# Patient Record
Sex: Female | Born: 1990 | Race: White | Hispanic: No | Marital: Single | Smoking: Current every day smoker
Health system: Southern US, Community
[De-identification: ages and names within clinical notes are randomized; demographics above are authoritative.]

## PROBLEM LIST (undated history)

## (undated) DIAGNOSIS — R000 Tachycardia, unspecified: Secondary | ICD-10-CM

## (undated) HISTORY — PX: OTHER SURGICAL HISTORY: SHX169

---
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
## 2012-10-23 ENCOUNTER — Encounter: Payer: Self-pay | Admitting: Cardiology

## 2012-12-06 ENCOUNTER — Encounter (HOSPITAL_BASED_OUTPATIENT_CLINIC_OR_DEPARTMENT_OTHER): Payer: Self-pay | Admitting: Emergency Medicine

## 2012-12-06 ENCOUNTER — Emergency Department (HOSPITAL_BASED_OUTPATIENT_CLINIC_OR_DEPARTMENT_OTHER)
Admission: EM | Admit: 2012-12-06 | Discharge: 2012-12-06 | Disposition: A | Discharge status: Home / Self Care | Payer: Medicaid Other | Attending: Emergency Medicine | Admitting: Emergency Medicine

## 2012-12-06 DIAGNOSIS — F17200 Nicotine dependence, unspecified, uncomplicated: Secondary | ICD-10-CM | POA: Insufficient documentation

## 2012-12-06 DIAGNOSIS — R82998 Other abnormal findings in urine: Secondary | ICD-10-CM | POA: Insufficient documentation

## 2012-12-06 DIAGNOSIS — R42 Dizziness and giddiness: Secondary | ICD-10-CM | POA: Insufficient documentation

## 2012-12-06 DIAGNOSIS — R8271 Bacteriuria: Secondary | ICD-10-CM

## 2012-12-06 DIAGNOSIS — R110 Nausea: Secondary | ICD-10-CM | POA: Insufficient documentation

## 2012-12-06 DIAGNOSIS — R52 Pain, unspecified: Secondary | ICD-10-CM | POA: Insufficient documentation

## 2012-12-06 DIAGNOSIS — J329 Chronic sinusitis, unspecified: Secondary | ICD-10-CM | POA: Insufficient documentation

## 2012-12-06 DIAGNOSIS — Z3202 Encounter for pregnancy test, result negative: Secondary | ICD-10-CM | POA: Insufficient documentation

## 2012-12-06 LAB — URINALYSIS, ROUTINE W REFLEX MICROSCOPIC
Nitrite: POSITIVE — AB
Protein, ur: NEGATIVE mg/dL
Specific Gravity, Urine: 1.024 (ref 1.005–1.030)
Urobilinogen, UA: 0.2 mg/dL (ref 0.0–1.0)

## 2012-12-06 LAB — URINE MICROSCOPIC-ADD ON

## 2012-12-06 LAB — PREGNANCY, URINE: Preg Test, Ur: NEGATIVE

## 2012-12-06 MED ORDER — ACETAMINOPHEN 325 MG PO TABS
650.0000 mg | ORAL_TABLET | Freq: Once | ORAL | Status: AC
  Administered 2012-12-06: 650 mg via ORAL
  Filled 2012-12-06: qty 2

## 2012-12-06 MED ORDER — TRIAMCINOLONE ACETONIDE 55 MCG/ACT NA AERO: 2.0000 | INHALATION_SPRAY | Freq: Every day | NASAL | Status: DC

## 2012-12-06 MED ORDER — SULFAMETHOXAZOLE-TRIMETHOPRIM 800-160 MG PO TABS: 1.0000 | ORAL_TABLET | Freq: Two times a day (BID) | ORAL | Status: DC

## 2012-12-06 NOTE — ED Provider Notes (Signed)
CSN: [id number]     Arrival date & time 12/06/12  1624 History   First MD Initiated Contact with Patient 12/06/12 1636     Chief Complaint  Patient presents with  . Fever   (Consider location/radiation/quality/duration/timing/severity/associated sxs/prior Treatment) HPI Comments: A 22 yr old present to ED complaining of fever since yesterday that has been getting worse. Fever of 102.9 at 2pm today and took 400 mg ibuprofen. She also has generalized body ache, nausea, constant frontal headache, dizziness with movement and chills. She denies CP, SOB, vomiting, light sensitivity, dysuria,urinary frequency or urgency. She also states that she has a history of tachycardia for many years and managed by cardiologist. She is due to seen by cardiologist. No chest pain.  Patient is a 22 y.o. female presenting with fever. The history is provided by the patient. No language interpreter was used.  Fever Max temp prior to arrival:  102.9 (2pm) Associated symptoms: headaches and nausea   Associated symptoms: no chest pain, no diarrhea, no dysuria, no ear pain, no rhinorrhea, no sore throat and no vomiting     Past Medical History  Diagnosis Date  . Chest pain   . Chest pain    Past Surgical History  Procedure Laterality Date  . None     Family History  Problem Relation Age of Onset  . Coronary artery disease Paternal Grandfather 30  . Coronary artery disease Paternal Uncle 67  . Hyperlipidemia Mother    History  Substance Use Topics  . Smoking status: Current Every Day Smoker    Types: Cigarettes  . Smokeless tobacco: Not on file  . Alcohol Use: No   OB History   Grav Para Term Preterm Abortions TAB SAB Ect Mult Living                 Review of Systems  Constitutional: Positive for fever.  HENT: Positive for sinus pressure. Negative for ear pain, rhinorrhea and sore throat.   Cardiovascular: Negative for chest pain.  Gastrointestinal: Positive for nausea. Negative for vomiting,  abdominal pain and diarrhea.  Endocrine: Negative for polyuria.  Genitourinary: Negative for dysuria and hematuria.  Musculoskeletal: Negative for neck stiffness.  Neurological: Positive for headaches.    Allergies  Review of patient's allergies indicates no known allergies.  Home Medications   Current Outpatient Rx  Name  Route  Sig  Dispense  Refill  . ibuprofen (ADVIL,MOTRIN) 400 MG tablet   Oral   Take 400 mg by mouth every 6 (six) hours as needed.         . Lisdexamfetamine Dimesylate (VYVANSE PO)   Oral   Take 1 capsule by mouth once.          . sulfamethoxazole-trimethoprim (SEPTRA DS) 800-160 MG per tablet   Oral   Take 1 tablet by mouth every 12 (twelve) hours.   14 tablet   0   . triamcinolone (NASACORT AQ) 55 MCG/ACT AERO nasal inhaler   Nasal   Place 2 sprays into the nose daily.   1 Inhaler   12    BP 100/66  Pulse 95  Temp(Src) 98.8 F (37.1 C) (Oral)  Resp 16  Ht 5\' 1"  (1.549 m)  Wt 103 lb (46.72 kg)  BMI 19.47 kg/m2  SpO2 100%  LMP 11/29/2012 Physical Exam  Constitutional: She is oriented to person, place, and time. She appears well-developed and well-nourished.  HENT:  Head: Normocephalic.  Right Ear: External ear normal.  Left Ear: External ear normal.  Nose: Mucosal edema present. Right sinus exhibits frontal sinus tenderness. Left sinus exhibits frontal sinus tenderness.  Mouth/Throat: Oropharynx is clear and moist.  Neck: Normal range of motion. Neck supple.  Cardiovascular: Normal rate and normal heart sounds.   No murmur heard. Pulmonary/Chest: Effort normal and breath sounds normal. She has no wheezes. She has no rales.  Abdominal: Soft. Bowel sounds are normal. She exhibits no distension. There is no tenderness.  Musculoskeletal: Normal range of motion.  Lymphadenopathy:    She has no cervical adenopathy.  Neurological: She is alert and oriented to person, place, and time.  Skin: Skin is warm and dry. No pallor.    ED  Course  Procedures (including critical care time) Labs Review Labs Reviewed  URINALYSIS, ROUTINE W REFLEX MICROSCOPIC - Abnormal; Notable for the following:    APPearance CLOUDY (*)    Hgb urine dipstick MODERATE (*)    Ketones, ur >80 (*)    Nitrite POSITIVE (*)    Leukocytes, UA SMALL (*)    All other components within normal limits  URINE MICROSCOPIC-ADD ON - Abnormal; Notable for the following:    Bacteria, UA MANY (*)    All other components within normal limits  URINE CULTURE  PREGNANCY, URINE   Imaging Review No results found.  EKG Interpretation   None       MDM   1. Asymptomatic bacteriuria   2. Sinusitis    She persistently denies symptomatic UTI, culture pending for bacteriuria found on UA today. Will treat with Septra for symptoms or sinusitis, which will cover UTI as well.     Arnoldo Hooker, PA-C 12/07/12 1515

## 2012-12-06 NOTE — ED Notes (Signed)
Pt c/o fever and body aches X2 days 

## 2012-12-08 NOTE — ED Provider Notes (Signed)
Medical screening examination/treatment/procedure(s) were performed by non-physician practitioner and as supervising physician I was immediately available for consultation/collaboration.  EKG Interpretation   None         Gwyneth Sprout, MD 12/08/12 1410

## 2012-12-09 ENCOUNTER — Encounter (HOSPITAL_COMMUNITY): Payer: Self-pay | Admitting: Emergency Medicine

## 2012-12-09 ENCOUNTER — Emergency Department (HOSPITAL_COMMUNITY): Payer: Medicaid Other

## 2012-12-09 ENCOUNTER — Emergency Department (HOSPITAL_COMMUNITY)
Admission: EM | Admit: 2012-12-09 | Discharge: 2012-12-10 | Disposition: A | Discharge status: Home / Self Care | Payer: Medicaid Other | Attending: Emergency Medicine | Admitting: Emergency Medicine

## 2012-12-09 DIAGNOSIS — R109 Unspecified abdominal pain: Secondary | ICD-10-CM | POA: Insufficient documentation

## 2012-12-09 DIAGNOSIS — F17200 Nicotine dependence, unspecified, uncomplicated: Secondary | ICD-10-CM | POA: Insufficient documentation

## 2012-12-09 DIAGNOSIS — M545 Low back pain, unspecified: Secondary | ICD-10-CM | POA: Insufficient documentation

## 2012-12-09 DIAGNOSIS — Z8679 Personal history of other diseases of the circulatory system: Secondary | ICD-10-CM | POA: Insufficient documentation

## 2012-12-09 DIAGNOSIS — Z3202 Encounter for pregnancy test, result negative: Secondary | ICD-10-CM | POA: Insufficient documentation

## 2012-12-09 DIAGNOSIS — Z79899 Other long term (current) drug therapy: Secondary | ICD-10-CM | POA: Insufficient documentation

## 2012-12-09 DIAGNOSIS — N390 Urinary tract infection, site not specified: Secondary | ICD-10-CM

## 2012-12-09 HISTORY — DX: Tachycardia, unspecified: R00.0

## 2012-12-09 LAB — BASIC METABOLIC PANEL
BUN: 6 mg/dL (ref 6–23)
CO2: 18 mEq/L — ABNORMAL LOW (ref 19–32)
Calcium: 8.7 mg/dL (ref 8.4–10.5)
Creatinine, Ser: 0.6 mg/dL (ref 0.50–1.10)
GFR calc non Af Amer: >90 mL/min (ref >90)
Glucose, Bld: 64 mg/dL — ABNORMAL LOW (ref 70–99)

## 2012-12-09 LAB — PREGNANCY, URINE: Preg Test, Ur: NEGATIVE

## 2012-12-09 LAB — URINALYSIS, ROUTINE W REFLEX MICROSCOPIC
Glucose, UA: NEGATIVE mg/dL
Hgb urine dipstick: NEGATIVE
Protein, ur: NEGATIVE mg/dL
Specific Gravity, Urine: 1.023 (ref 1.005–1.030)

## 2012-12-09 LAB — CBC
HCT: 39.1 % (ref 36.0–46.0)
Hemoglobin: 13.7 g/dL (ref 12.0–15.0)
MCH: 30.9 pg (ref 26.0–34.0)
MCHC: 35 g/dL (ref 30.0–36.0)
MCV: 88.3 fL (ref 78.0–100.0)
RBC: 4.43 MIL/uL (ref 3.87–5.11)

## 2012-12-09 LAB — URINE CULTURE: Colony Count: >=100000

## 2012-12-09 MED ORDER — MORPHINE SULFATE 4 MG/ML IJ SOLN
4.0000 mg | Freq: Once | INTRAMUSCULAR | Status: AC
  Administered 2012-12-09: 4 mg via INTRAVENOUS
  Filled 2012-12-09: qty 1

## 2012-12-09 MED ORDER — CEFTRIAXONE SODIUM 1 G IJ SOLR
1.0000 g | Freq: Once | INTRAMUSCULAR | Status: AC
  Administered 2012-12-09: 1 g via INTRAVENOUS
  Filled 2012-12-09: qty 10

## 2012-12-09 MED ORDER — SODIUM CHLORIDE 0.9 % IV BOLUS (SEPSIS)
1000.0000 mL | Freq: Once | INTRAVENOUS | Status: AC
  Administered 2012-12-09: 1000 mL via INTRAVENOUS

## 2012-12-09 MED ORDER — ONDANSETRON HCL 4 MG/2ML IJ SOLN
4.0000 mg | Freq: Once | INTRAMUSCULAR | Status: AC
  Administered 2012-12-09: 4 mg via INTRAVENOUS
  Filled 2012-12-09: qty 2

## 2012-12-09 MED ORDER — MORPHINE SULFATE 4 MG/ML IJ SOLN
4.0000 mg | Freq: Once | INTRAMUSCULAR | Status: AC
  Administered 2012-12-10: 4 mg via INTRAVENOUS
  Filled 2012-12-09: qty 1

## 2012-12-09 NOTE — ED Notes (Signed)
PA at bedside.

## 2012-12-09 NOTE — ED Notes (Signed)
The pt has had abd pain urinary symptoms for 3-4 days. Painful urination.  [facility] and diagnosed with a uti.  lmp 1 week

## 2012-12-09 NOTE — ED Provider Notes (Signed)
CSN: [id number]     Arrival date & time 12/09/12  1826 History   First MD Initiated Contact with Patient 12/09/12 2021     Chief Complaint  Patient presents with  . Emesis   (Consider location/radiation/quality/duration/timing/severity/associated sxs/prior Treatment) HPI Comments: The patient is a 22 year old female presenting to the emergency department for suprapubic abdominal pain with radiation to low back with associated dysuria for 4 days. Patient states she was seen [facility] ago and diagnosed with a urinary tract infection. She states since then she has developed nausea and vomiting and has been unable to take her antibiotic. She states she's had only 3 episodes of emesis that she describes as nonbloody nonbilious. She states her pain comes in waves and describes it as sharp at worst. Her pain is currently a 9/10. No abdominal surgical history.   Patient is a 22 y.o. female presenting with vomiting.  Emesis Associated symptoms: abdominal pain   Associated symptoms: no chills and no diarrhea     Past Medical History  Diagnosis Date  . Chest pain   . Chest pain   . Tachycardia    Past Surgical History  Procedure Laterality Date  . None     Family History  Problem Relation Age of Onset  . Coronary artery disease Paternal Grandfather 30  . Coronary artery disease Paternal Uncle 23  . Hyperlipidemia Mother    History  Substance Use Topics  . Smoking status: Current Every Day Smoker    Types: Cigarettes  . Smokeless tobacco: Not on file  . Alcohol Use: No   OB History   Grav Para Term Preterm Abortions TAB SAB Ect Mult Living                 Review of Systems  Constitutional: Negative for chills.  Gastrointestinal: Positive for nausea, vomiting and abdominal pain. Negative for diarrhea and constipation.  Genitourinary: Positive for dysuria, frequency and flank pain. Negative for vaginal bleeding, vaginal discharge and vaginal pain.    Musculoskeletal: Positive for back pain.  All other systems reviewed and are negative.    Allergies  Review of patient's allergies indicates no known allergies.  Home Medications   Current Outpatient Rx  Name  Route  Sig  Dispense  Refill  . ibuprofen (ADVIL,MOTRIN) 400 MG tablet   Oral   Take 400 mg by mouth every 6 (six) hours as needed.         Marland Kitchen levonorgestrel-ethinyl estradiol (ORSYTHIA) 0.1-20 MG-MCG tablet   Oral   Take 1 tablet by mouth daily.         . Phenazopyridine HCl (AZO STANDARD MAXIMUM STRENGTH) 97.5 MG TABS   Oral   Take 1 tablet by mouth daily.         Marland Kitchen sulfamethoxazole-trimethoprim (SEPTRA DS) 800-160 MG per tablet   Oral   Take 1 tablet by mouth every 12 (twelve) hours.   14 tablet   0   . triamcinolone (NASACORT AQ) 55 MCG/ACT AERO nasal inhaler   Nasal   Place 2 sprays into the nose daily.   1 Inhaler   12    BP 118/66  Pulse 127  Temp(Src) 99.9 F (37.7 C) (Oral)  Resp 18  SpO2 97%  LMP 11/29/2012 Physical Exam  Constitutional: She is oriented to person, place, and time. She appears well-developed and well-nourished. No distress.  HENT:  Head: Normocephalic and atraumatic.  Right Ear: External ear normal.  Left Ear: External ear normal.  Nose: Nose normal.  Eyes: Conjunctivae are normal.  Neck: Neck supple.  Cardiovascular: Normal rate, regular rhythm and normal heart sounds.   Pulmonary/Chest: Effort normal and breath sounds normal. No respiratory distress.  Abdominal: Soft. Bowel sounds are normal. She exhibits no distension. There is tenderness in the suprapubic area. There is no rigidity, no rebound and no guarding.  Musculoskeletal: Normal range of motion. She exhibits no edema.       Arms: Neurological: She is alert and oriented to person, place, and time.  Skin: Skin is warm and dry. She is not diaphoretic.    ED Course  Procedures (including critical care time) Medications  sodium chloride 0.9 % bolus 1,000 mL  (1,000 mLs Intravenous New Bag/Given 12/09/12 2130)  ondansetron (ZOFRAN) injection 4 mg (4 mg Intravenous Given 12/09/12 2130)  morphine 4 MG/ML injection 4 mg (4 mg Intravenous Given 12/09/12 2131)    Labs Review Labs Reviewed  URINALYSIS, ROUTINE W REFLEX MICROSCOPIC - Abnormal; Notable for the following:    Color, Urine ORANGE (*)    Bilirubin Urine SMALL (*)    Ketones, ur >80 (*)    Nitrite POSITIVE (*)    Leukocytes, UA MODERATE (*)    All other components within normal limits  URINE MICROSCOPIC-ADD ON - Abnormal; Notable for the following:    Squamous Epithelial / LPF FEW (*)    All other components within normal limits  BASIC METABOLIC PANEL - Abnormal; Notable for the following:    Sodium 127 (*)    Chloride 94 (*)    CO2 18 (*)    Glucose, Bld 64 (*)    All other components within normal limits  URINE CULTURE  PREGNANCY, URINE  CBC   Imaging Review Ct Abdomen Pelvis Wo Contrast  12/09/2012   CLINICAL DATA:  Left flank pain, dysuria  EXAM: CT ABDOMEN AND PELVIS WITHOUT CONTRAST  TECHNIQUE: Multidetector CT imaging of the abdomen and pelvis was performed following the standard protocol without intravenous contrast.  COMPARISON:  None.  FINDINGS: The visualized lung bases are clear.  The liver demonstrates a normal unenhanced appearance. The gallbladder, spleen, adrenal glands, and pancreas demonstrate a normal unenhanced appearance.  The kidneys are equal in size without evidence of nephrolithiasis or hydronephrosis. No stones are seen along the course of either renal collecting system.  There is no evidence of bowel obstruction. No abnormal wall thickening or inflammatory stranding seen about the bowels. Visualized appendix is within normal limits without evidence of acute appendicitis.  Bladder is unremarkable.  1.8 x 2.2 cm hypodense cyst is noted within the right ovary, likely physiologic. Uterus and ovaries are otherwise unremarkable.  No free air or fluid. No enlarged  intra-abdominal or pelvic lymph nodes are identified.  Osseous structures are within normal limits.  IMPRESSION: 1. No CT evidence of nephrolithiasis or obstructive uropathy. 2. No acute intra-abdominal or pelvic process identified. No findings to explain left-sided flank pain. 3. Probable physiologic right ovarian cyst.   Electronically Signed   By: Rise Mu M.D.   On: 12/09/2012 22:24    EKG Interpretation   None       MDM  No diagnosis found.  Afebrile, NAD, non-toxic appearing, AAOx4. I have reviewed nursing notes, vital signs, and all appropriate lab and imaging results for this patient. Patient noted to be mildly acidotic, hydrated patient and will recheck BMP to ensure improvement. No evidence of pyelonephritis or kidney stone on CT scan. Notified patient of cyst findings. Will d/c Septra  d/t intolerance and change Abx. Will prescribe zofran for nausea. Patient received IV Rocephin in ED.    1:21 AM BMP still pending. Patient pain controlled and able to tolerate PO fluids in ED w/o difficulty. Will sign patient out to [place], PA-C with plan to d/c patient if improvement in labs.      Jeannetta Ellis, PA-C 12/10/12 0124

## 2012-12-10 LAB — BASIC METABOLIC PANEL
BUN: 5 mg/dL — ABNORMAL LOW (ref 6–23)
Calcium: 7.2 mg/dL — ABNORMAL LOW (ref 8.4–10.5)
Creatinine, Ser: 0.55 mg/dL (ref 0.50–1.10)
GFR calc Af Amer: >90 mL/min (ref >90)
GFR calc non Af Amer: >90 mL/min (ref >90)
Glucose, Bld: 90 mg/dL (ref 70–99)
Sodium: 131 mEq/L — ABNORMAL LOW (ref 135–145)

## 2012-12-10 MED ORDER — ONDANSETRON 4 MG PO TBDP: 4.0000 mg | ORAL_TABLET | Freq: Three times a day (TID) | ORAL | Status: DC | PRN

## 2012-12-10 MED ORDER — GI COCKTAIL ~~LOC~~
30.0000 mL | Freq: Once | ORAL | Status: AC
  Administered 2012-12-10: 30 mL via ORAL
  Filled 2012-12-10: qty 30

## 2012-12-10 MED ORDER — CIPROFLOXACIN HCL 500 MG PO TABS: 500.0000 mg | ORAL_TABLET | Freq: Two times a day (BID) | ORAL | Status: DC

## 2012-12-10 NOTE — ED Provider Notes (Signed)
Medical screening examination/treatment/procedure(s) were performed by non-physician practitioner and as supervising physician I was immediately available for consultation/collaboration.  Shaleen Talamantez M Nazier Neyhart, MD 12/10/12 1242 

## 2012-12-12 LAB — URINE CULTURE: Colony Count: 60000

## 2013-12-01 ENCOUNTER — Emergency Department (HOSPITAL_COMMUNITY)
Admission: EM | Admit: 2013-12-01 | Discharge: 2013-12-02 | Disposition: A | Discharge status: Home / Self Care | Payer: Self-pay | Attending: Emergency Medicine | Admitting: Emergency Medicine

## 2013-12-01 ENCOUNTER — Encounter (HOSPITAL_COMMUNITY): Payer: Self-pay | Admitting: Emergency Medicine

## 2013-12-01 DIAGNOSIS — F39 Unspecified mood [affective] disorder: Secondary | ICD-10-CM

## 2013-12-01 DIAGNOSIS — R000 Tachycardia, unspecified: Secondary | ICD-10-CM | POA: Insufficient documentation

## 2013-12-01 DIAGNOSIS — Z720 Tobacco use: Secondary | ICD-10-CM | POA: Insufficient documentation

## 2013-12-01 DIAGNOSIS — R45851 Suicidal ideations: Secondary | ICD-10-CM

## 2013-12-01 DIAGNOSIS — F329 Major depressive disorder, single episode, unspecified: Secondary | ICD-10-CM

## 2013-12-01 DIAGNOSIS — T43602A Poisoning by unspecified psychostimulants, intentional self-harm, initial encounter: Secondary | ICD-10-CM | POA: Insufficient documentation

## 2013-12-01 DIAGNOSIS — Z793 Long term (current) use of hormonal contraceptives: Secondary | ICD-10-CM | POA: Insufficient documentation

## 2013-12-01 DIAGNOSIS — F32A Depression, unspecified: Secondary | ICD-10-CM | POA: Insufficient documentation

## 2013-12-01 LAB — CBC
HEMATOCRIT: 41.2 % (ref 36.0–46.0)
Hemoglobin: 14.2 g/dL (ref 12.0–15.0)
MCH: 30.1 pg (ref 26.0–34.0)
MCHC: 34.5 g/dL (ref 30.0–36.0)
MCV: 87.5 fL (ref 78.0–100.0)
Platelets: 387 10*3/uL (ref 150–400)
RBC: 4.71 MIL/uL (ref 3.87–5.11)
RDW: 12.5 % (ref 11.5–15.5)
WBC: 14.5 10*3/uL — AB (ref 4.0–10.5)

## 2013-12-01 LAB — COMPREHENSIVE METABOLIC PANEL
ALBUMIN: 4.2 g/dL (ref 3.5–5.2)
ALK PHOS: 59 U/L (ref 39–117)
ALT: 10 U/L (ref 0–35)
AST: 18 U/L (ref 0–37)
Anion gap: 20 — ABNORMAL HIGH (ref 5–15)
BUN: 7 mg/dL (ref 6–23)
CO2: 18 mEq/L — ABNORMAL LOW (ref 19–32)
Calcium: 9.9 mg/dL (ref 8.4–10.5)
Chloride: 105 mEq/L (ref 96–112)
Creatinine, Ser: 0.6 mg/dL (ref 0.50–1.10)
GFR calc Af Amer: >90 mL/min (ref >90)
GFR calc non Af Amer: >90 mL/min (ref >90)
GLUCOSE: 91 mg/dL (ref 70–99)
POTASSIUM: 3.9 meq/L (ref 3.7–5.3)
SODIUM: 143 meq/L (ref 137–147)
Total Bilirubin: 0.4 mg/dL (ref 0.3–1.2)
Total Protein: 7.9 g/dL (ref 6.0–8.3)

## 2013-12-01 LAB — RAPID URINE DRUG SCREEN, HOSP PERFORMED
AMPHETAMINES: NOT DETECTED
BARBITURATES: NOT DETECTED
Benzodiazepines: NOT DETECTED
COCAINE: NOT DETECTED
OPIATES: NOT DETECTED
TETRAHYDROCANNABINOL: NOT DETECTED

## 2013-12-01 LAB — SALICYLATE LEVEL: Salicylate Lvl: <2 mg/dL — ABNORMAL LOW (ref 2.8–20.0)

## 2013-12-01 LAB — ETHANOL: Alcohol, Ethyl (B): <11 mg/dL (ref 0–11)

## 2013-12-01 LAB — ACETAMINOPHEN LEVEL

## 2013-12-01 MED ORDER — ACETAMINOPHEN 325 MG PO TABS: 650.0000 mg | ORAL_TABLET | ORAL | Status: DC | PRN

## 2013-12-01 MED ORDER — HYDROXYZINE HCL 25 MG PO TABS: 25.0000 mg | ORAL_TABLET | Freq: Four times a day (QID) | ORAL | Status: DC | PRN

## 2013-12-01 MED ORDER — ONDANSETRON HCL 4 MG PO TABS: 4.0000 mg | ORAL_TABLET | Freq: Three times a day (TID) | ORAL | Status: DC | PRN

## 2013-12-01 MED ORDER — SODIUM CHLORIDE 0.9 % IV BOLUS (SEPSIS)
500.0000 mL | Freq: Once | INTRAVENOUS | Status: AC
  Administered 2013-12-01: 500 mL via INTRAVENOUS

## 2013-12-01 MED ORDER — MENTHOL 3 MG MT LOZG
1.0000 | LOZENGE | OROMUCOSAL | Status: DC | PRN
  Filled 2013-12-01: qty 9

## 2013-12-01 MED ORDER — IBUPROFEN 200 MG PO TABS: 600.0000 mg | ORAL_TABLET | Freq: Three times a day (TID) | ORAL | Status: DC | PRN

## 2013-12-01 MED ORDER — ALUM & MAG HYDROXIDE-SIMETH 200-200-20 MG/5ML PO SUSP: 30.0000 mL | ORAL | Status: DC | PRN

## 2013-12-01 MED ORDER — LEVONORGESTREL-ETHINYL ESTRAD 0.1-20 MG-MCG PO TABS: 1.0000 | ORAL_TABLET | Freq: Every day | ORAL | Status: DC

## 2013-12-01 MED ORDER — ZOLPIDEM TARTRATE 5 MG PO TABS: 5.0000 mg | ORAL_TABLET | Freq: Every evening | ORAL | Status: DC | PRN
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
## 2013-12-01 NOTE — ED Notes (Signed)
[employer] still in room with patient.
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
## 2013-12-01 NOTE — ED Notes (Signed)
Report received from Caroline RN. Pt. Alert and oriented in no distress denies SI, HI, AVH and pain. Will continue to monitor for safety. Pt. Instructed to come to me with problems or concerns. Q 15 minute checks continue. 

## 2013-12-02 DIAGNOSIS — F4323 Adjustment disorder with mixed anxiety and depressed mood: Secondary | ICD-10-CM

## 2013-12-02 NOTE — Progress Notes (Signed)
MHT initiated psychiatric placement at the following facilities:  AT [facility] 1)[facility] 2)[facility] 3)[facility] 4)[facility] 5)[facility] 6)[place] [facility] 9)[facility] 11)[facility] 12)[facility] 13)[facility]  FAXED REFERRALS 1)[place] 2)[facility] 3)[facility] 4)[facility] 5)[facility] 6)[facility]  [facility])[place] [facility]  Blain PaisMichelle L Lemar Bakos, MHT/NS

## 2013-12-02 NOTE — Discharge Instructions (Signed)
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
## 2013-12-02 NOTE — ED Notes (Signed)
[facility]. IVC paperwork will need to be faxed to [phone]) [phone].

## 2015-01-13 IMAGING — CT CT ABD-PELV W/O CM
2 of 4 series · 17 of 46 positions shown, 19 images · non-contrast
Comparison: None.

CLINICAL DATA: Left flank pain, dysuria

EXAM:
CT ABDOMEN AND PELVIS WITHOUT CONTRAST
TECHNIQUE: Multidetector CT imaging of the abdomen and pelvis was performed
following the standard protocol without intravenous contrast.

[Series 2: stone study · axial · 0.64mm/px · z∈[+100,+505]mm · 14 of 89 slices shown, 16 images]
[im 4/89  soft-tissue]
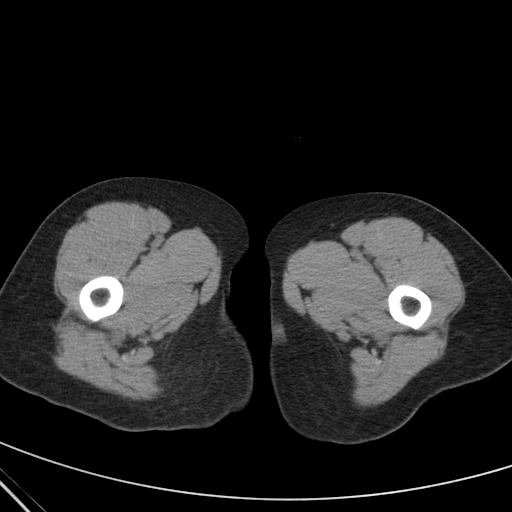
[im 4/89  bone]
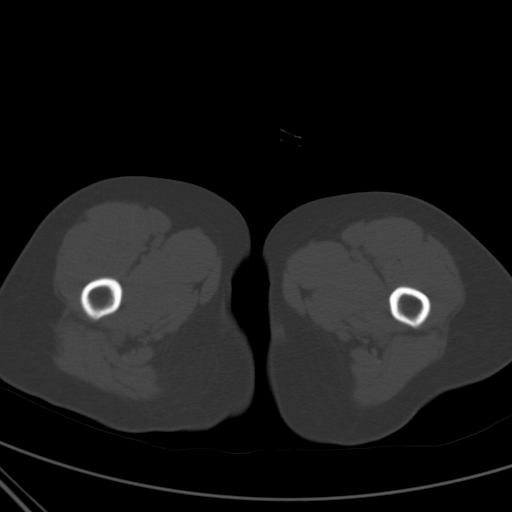
[im 12/89  soft-tissue]
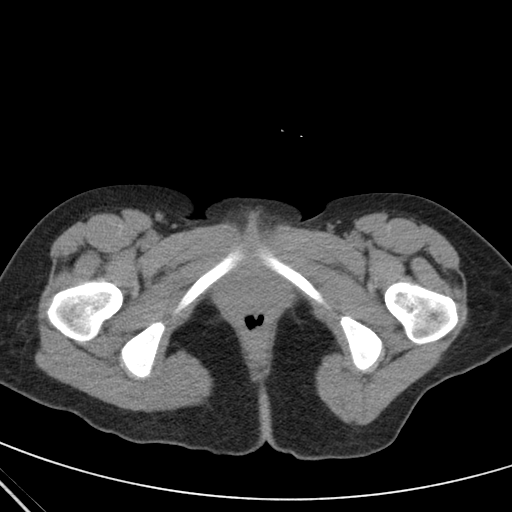
[im 19/89  soft-tissue]
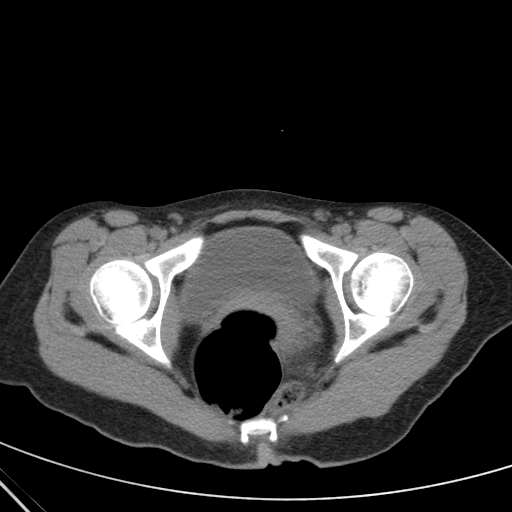
[im 23/89  soft-tissue]
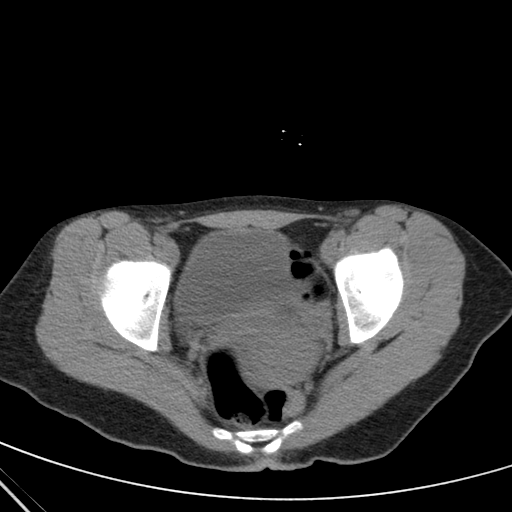
[im 30/89  soft-tissue]
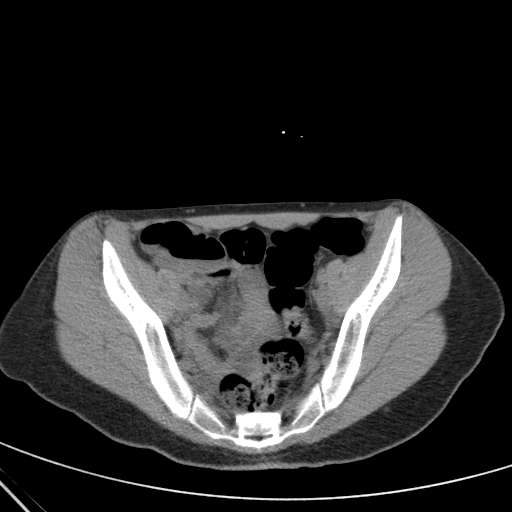
[im 37/89  soft-tissue]
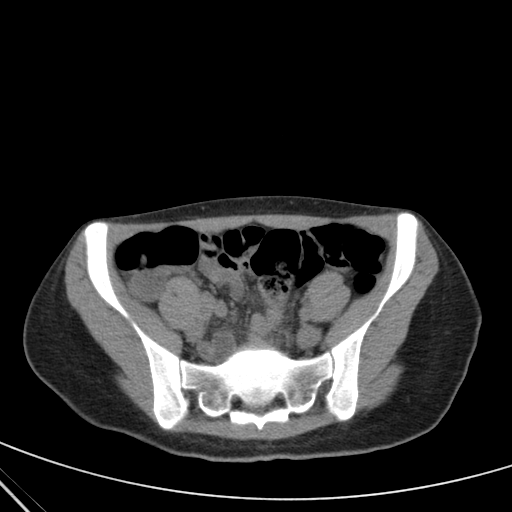
[im 41/89  soft-tissue]
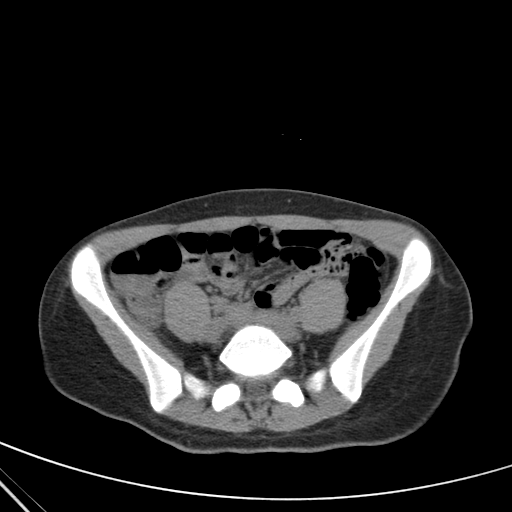
[im 48/89  soft-tissue]
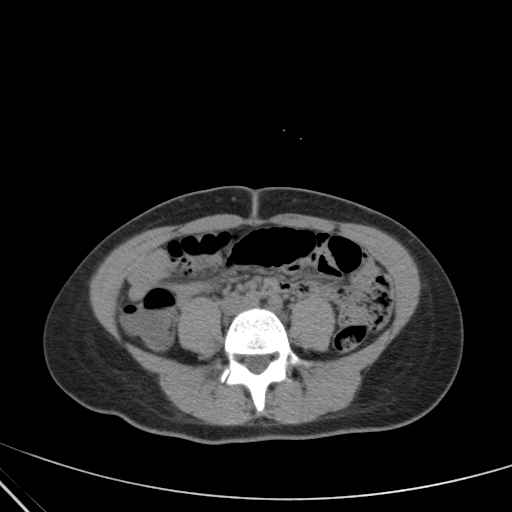
[im 52/89  soft-tissue]
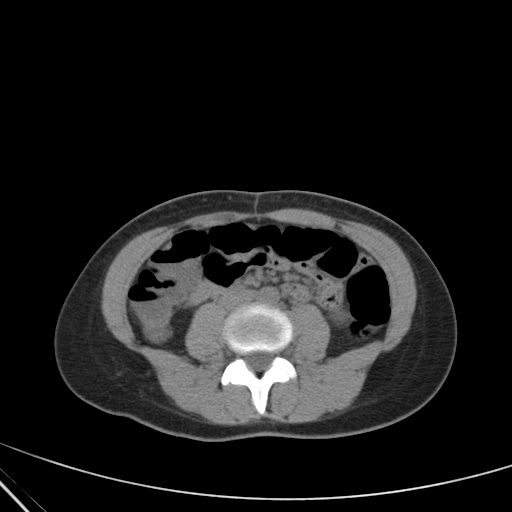
[im 52/89  bone]
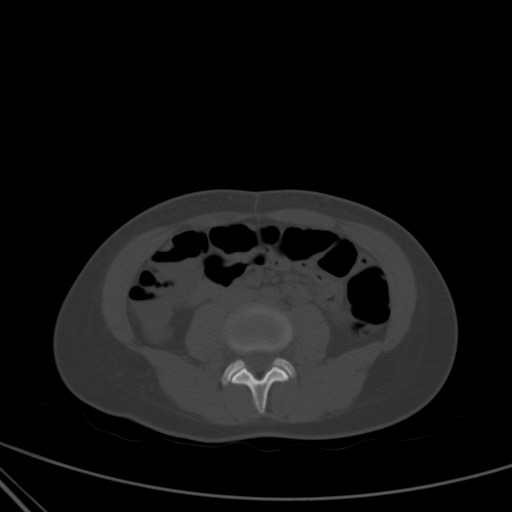
[im 59/89  soft-tissue]
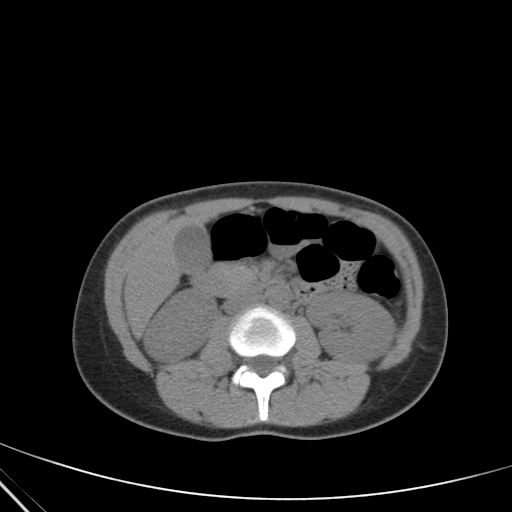
[im 67/89  soft-tissue]
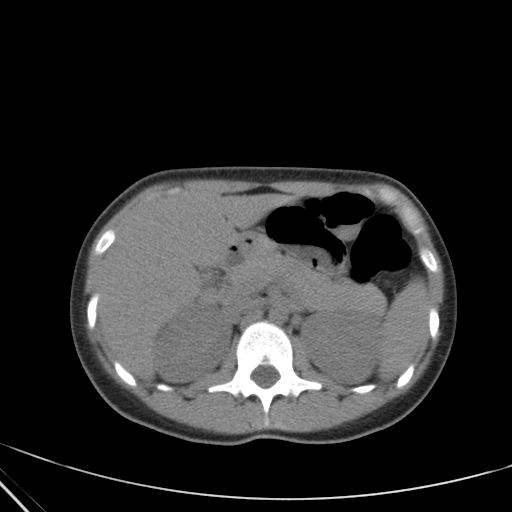
[im 70/89  soft-tissue]
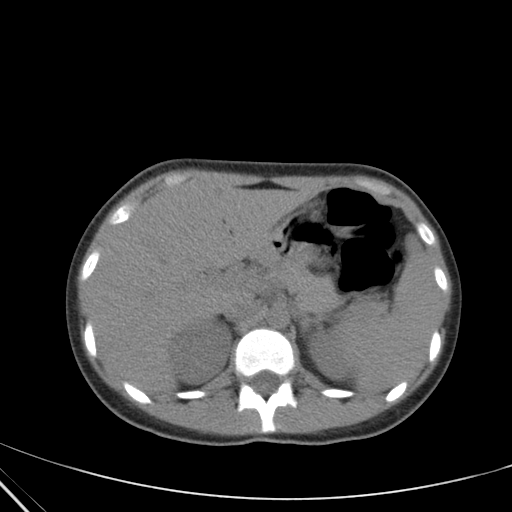
[im 78/89  soft-tissue]
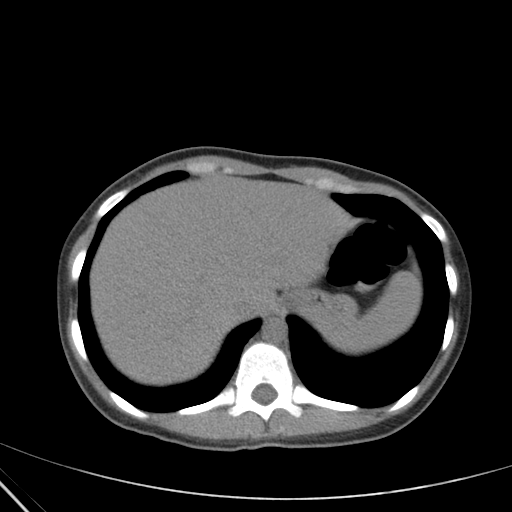
[im 85/89  soft-tissue]
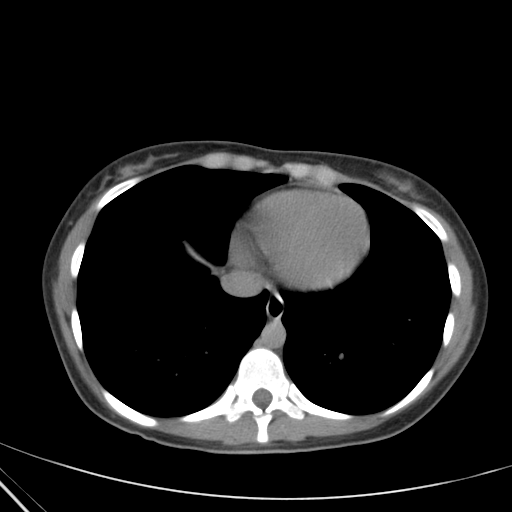

[mpr, coronals, coronal · coronal · 0.86mm/px · 3 of 79 slices shown]
[im 27/79  soft-tissue]
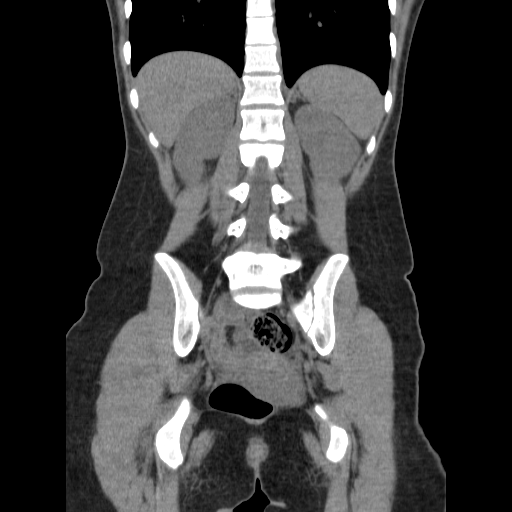
[im 35/79  soft-tissue]
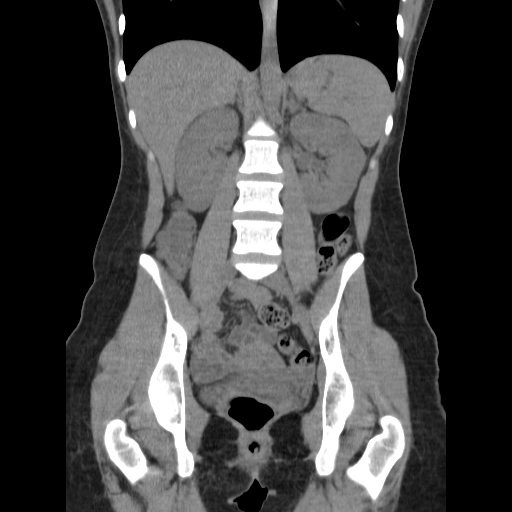
[im 44/79  soft-tissue]
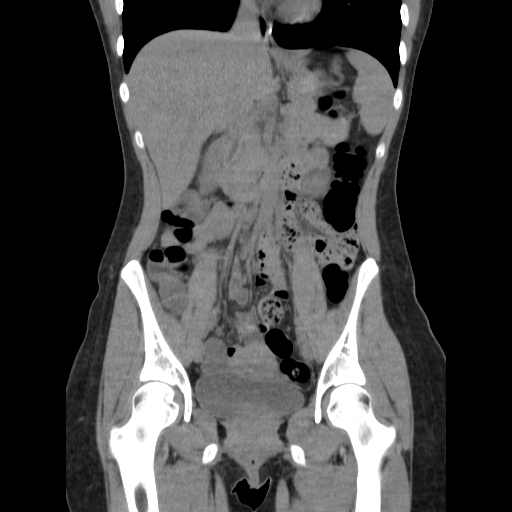

[17 of 46 positions shown; findings below may reference images not displayed]

FINDINGS: The visualized lung bases are clear.

The liver demonstrates a normal unenhanced appearance. The
gallbladder, spleen, adrenal glands, and pancreas demonstrate a
normal unenhanced appearance.

The kidneys are equal in size without evidence of nephrolithiasis or
hydronephrosis. No stones are seen along the course of either renal
collecting system.

There is no evidence of bowel obstruction. No abnormal wall
thickening or inflammatory stranding seen about the bowels.
Visualized appendix is within normal limits without evidence of
acute appendicitis.

Bladder is unremarkable.

1.8 x 2.2 cm hypodense cyst is noted within the right ovary, likely
physiologic. Uterus and ovaries are otherwise unremarkable.

No free air or fluid. No enlarged intra-abdominal or pelvic lymph
nodes are identified.

Osseous structures are within normal limits.
IMPRESSION: 1. No CT evidence of nephrolithiasis or obstructive uropathy.
2. No acute intra-abdominal or pelvic process identified. No
findings to explain left-sided flank pain.
3. Probable physiologic right ovarian cyst.
